# Patient Record
Sex: Female | Born: 1950 | Race: White | Hispanic: No | Marital: Married
Health system: Southern US, Community
[De-identification: ages and names within clinical notes are randomized; demographics above are authoritative.]

## PROBLEM LIST (undated history)

## (undated) DIAGNOSIS — E079 Disorder of thyroid, unspecified: Secondary | ICD-10-CM

## (undated) DIAGNOSIS — E039 Hypothyroidism, unspecified: Secondary | ICD-10-CM

## (undated) DIAGNOSIS — K900 Celiac disease: Secondary | ICD-10-CM

## (undated) DIAGNOSIS — M810 Age-related osteoporosis without current pathological fracture: Secondary | ICD-10-CM

## (undated) HISTORY — DX: Celiac disease: K90.0

---

## 2002-01-02 ENCOUNTER — Ambulatory Visit (HOSPITAL_COMMUNITY): Admission: RE | Admit: 2002-01-02 | Discharge: 2002-01-02 | Payer: Self-pay | Admitting: Ophthalmology

## 2003-02-09 ENCOUNTER — Other Ambulatory Visit: Admission: RE | Admit: 2003-02-09 | Discharge: 2003-02-09 | Payer: Self-pay | Admitting: *Deleted

## 2005-01-25 ENCOUNTER — Encounter: Admission: RE | Admit: 2005-01-25 | Discharge: 2005-01-25 | Payer: Self-pay | Admitting: *Deleted

## 2012-05-13 ENCOUNTER — Ambulatory Visit: Payer: 59 | Attending: Internal Medicine | Admitting: Physical Therapy

## 2012-05-13 DIAGNOSIS — IMO0001 Reserved for inherently not codable concepts without codable children: Secondary | ICD-10-CM | POA: Insufficient documentation

## 2012-05-13 DIAGNOSIS — M542 Cervicalgia: Secondary | ICD-10-CM | POA: Insufficient documentation

## 2012-05-13 DIAGNOSIS — M25519 Pain in unspecified shoulder: Secondary | ICD-10-CM | POA: Insufficient documentation

## 2012-05-21 ENCOUNTER — Encounter: Payer: 59 | Admitting: Physical Therapy

## 2012-05-23 ENCOUNTER — Encounter: Payer: 59 | Admitting: Physical Therapy

## 2012-05-28 ENCOUNTER — Encounter: Payer: 59 | Admitting: Physical Therapy

## 2012-05-30 ENCOUNTER — Encounter: Payer: 59 | Admitting: Physical Therapy

## 2013-11-12 ENCOUNTER — Ambulatory Visit: Payer: 59 | Admitting: Dietician

## 2014-01-21 ENCOUNTER — Encounter: Payer: 59 | Attending: Gastroenterology | Admitting: Dietician

## 2014-01-21 ENCOUNTER — Encounter: Payer: Self-pay | Admitting: Dietician

## 2014-01-21 VITALS — Ht 64.0 in | Wt 168.1 lb

## 2014-01-21 DIAGNOSIS — K900 Celiac disease: Secondary | ICD-10-CM | POA: Diagnosis present

## 2014-01-21 NOTE — Progress Notes (Signed)
  Medical Nutrition Therapy:  Appt start time: 0915 end time:  1000.   Assessment:  Primary concerns today: Penny Russell is here today since she has celiac disease. Was [place] Fermodiagnosed in April but feels like she has likely had it for years. Does not have many symptoms except "burping".  Has had hair thinning, low iron, low Vitamin D, and low calcium. Levels have all increased since since taking supplements and hair is less thin.  States she likes "junk food" and buying gluten free products since April. Starting to look for more "hidden gluten". Tries to follow diet but isn't 100% following it.   Husband recently had back surgery. Lives with him and does the food shopping and food preparation. Goes out 2-3 x week, asks if they have a "gluten free menu". Travels pretty often to the beach, and a few other trips per year (every 2-3 months).   Has separated out condiments at home though still shares a toaster. Trying not to sharing food prep items.    Preferred Learning Style:   No preference indicated   Learning Readiness:   Ready  MEDICATIONS: see list   DIETARY INTAKE:  Usual eating pattern includes 3 meals and 1-3 snacks per day.  Avoided foods include: gluten, salmon, beet    24-hr recall:  B ( AM): egg and gluten free toast with sugar free jelly, gluten free [place]key bacon maybe GF oatmeal with coffee, skim milk Snk ( AM): hershey's kisses or bar, GF chex cereal, GF pretzels, apple or banana  L ( PM): grilled chicken or [place]urkey or grilled nuggets Snk ( PM): depends - hershey's kisses or bar, GF chex cereal, GF pretzels, apple or banana  D ( PM): grilled meat (beef, fish, chicken, hamburger) sometimes beans with vegetables, potatoes/sweet potatoes Snk ( PM): cereal, low carb ice cream popsicle Beverages: coffee, milk, Diet Coke, water, crystal light   Usual physical activity: 10,000 steps per day  Estimated energy needs: 1600 calories 180 g carbohydrates 120 g protein 44 g fat  Progress  Towards Goal(s):  In progress.   Nutritional Diagnosis:  [place]-1.4 Altered GI function As related to intake of gluten containing foods.  As evidenced by diagnosis of Celiac Disease.    Intervention:  Nutrition counseling provided. Plan: Separate iron and calcium since they bind together. Continue following a gluten free diet as closely as possible. Be wary of cross contamination in your kitchen and when you go out to eat.  Check out [facility] website for reliable information: https://[email or web address]/handouts.[email or web address]   Teaching Method Utilized:  Visual Auditory Hands on  Handouts given during visit include:  Gluten-Free Eating Handout  Low Sodium Seasoning Tips  Barriers to learning/adherence to lifestyle change: none  Demonstrated degree of understanding via:  Teach Back   Monitoring/Evaluation:  Dietary intake, exercise, and body weight prn.

## 2014-01-21 NOTE — Patient Instructions (Addendum)
Separate iron and calcium since they bind together. Continue following a gluten free diet as closely as possible. Be wary of cross contamination in your kitchen and when you go out to eat.  Check out [facility] website for reliable information: https://glutenfreediet.ca/handouts.[email or web address]

## 2017-03-27 ENCOUNTER — Other Ambulatory Visit: Payer: Self-pay | Admitting: Internal Medicine

## 2017-03-27 DIAGNOSIS — R1033 Periumbilical pain: Secondary | ICD-10-CM

## 2017-04-02 ENCOUNTER — Ambulatory Visit
Admission: RE | Admit: 2017-04-02 | Discharge: 2017-04-02 | Disposition: A | Discharge status: Home / Self Care | Payer: 59 | Source: Ambulatory Visit | Attending: Internal Medicine | Admitting: Internal Medicine

## 2017-04-02 DIAGNOSIS — R1033 Periumbilical pain: Secondary | ICD-10-CM

## 2017-04-02 MED ORDER — IOPAMIDOL (ISOVUE-300) INJECTION 61%
100.0000 mL | Freq: Once | INTRAVENOUS | Status: AC | PRN
  Administered 2017-04-02: 100 mL via INTRAVENOUS

## 2018-12-25 IMAGING — CT CT ABD-PELV W/ CM
3 of 5 series · 13 of 36 positions shown, 19 images · IV contrast (READICAT/WATER & [ID] ISOVUE 300)
Comparison: None.

CLINICAL DATA: Umbilical pain for 4 weeks.

EXAM:
CT ABDOMEN AND PELVIS WITH CONTRAST
TECHNIQUE: Multidetector CT imaging of the abdomen and pelvis was performed
using the standard protocol following bolus administration of
intravenous contrast.
CONTRAST:  100mL [id number]-RPP IOPAMIDOL ([id number]-RPP) INJECTION 61%

[Series 3: abd/pelvis with · axial · 0.76mm/px · z∈[-291,-16]mm · 6 of 72 slices shown, 11 images]
[im 11/72  soft-tissue]
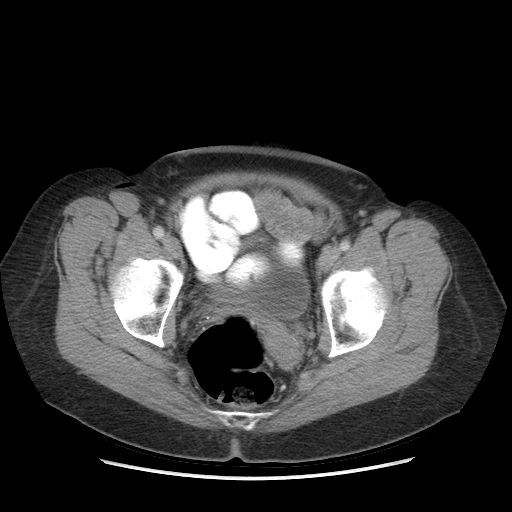
[im 11/72  bone]
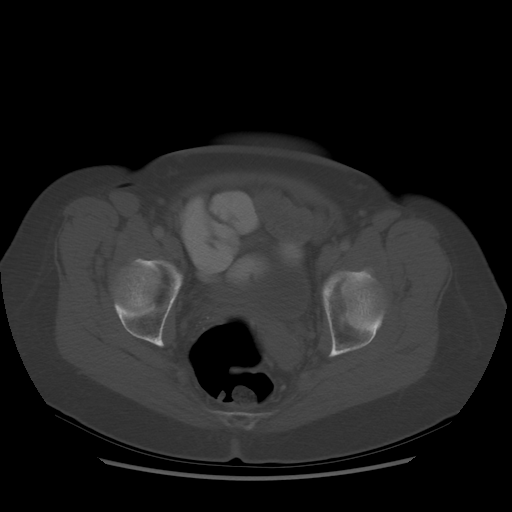
[im 21/72  soft-tissue]
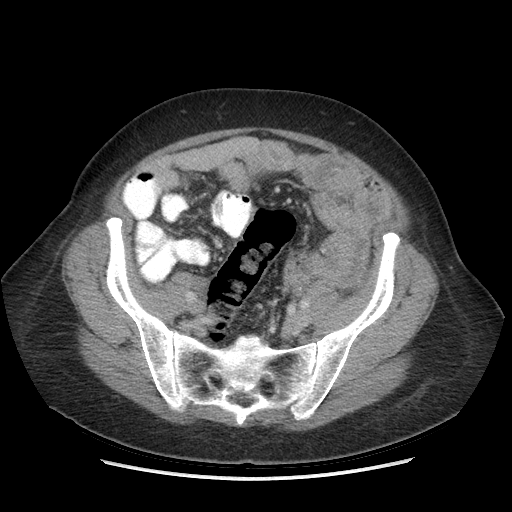
[im 31/72  soft-tissue]
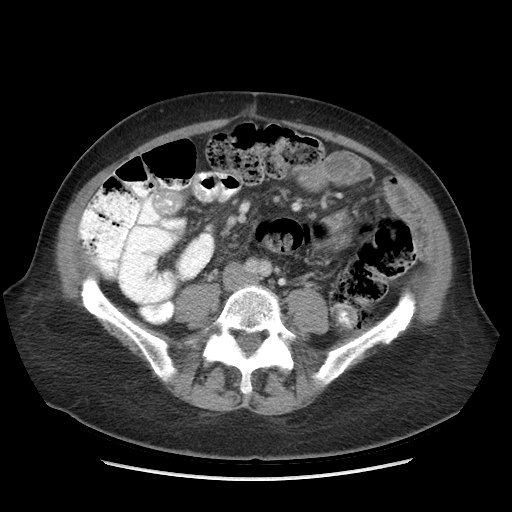
[im 31/72  lung]
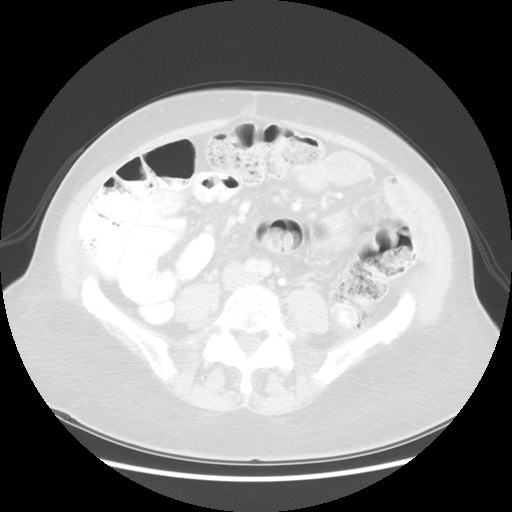
[im 41/72  soft-tissue]
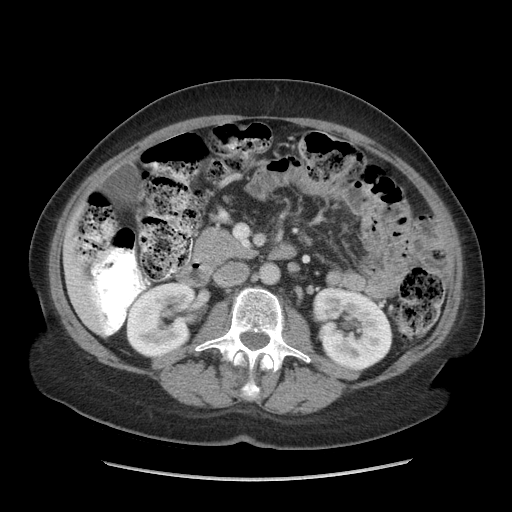
[im 41/72  lung]
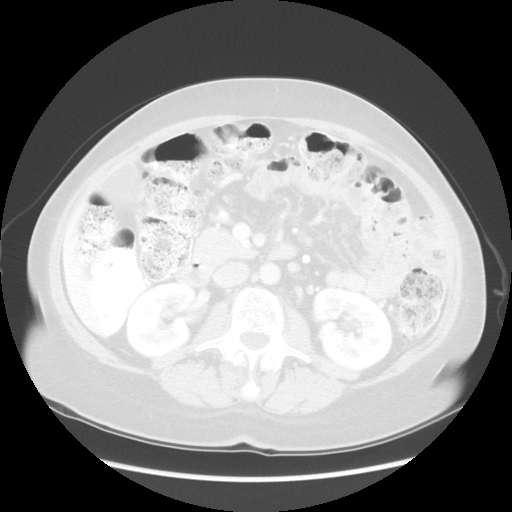
[im 51/72  soft-tissue]
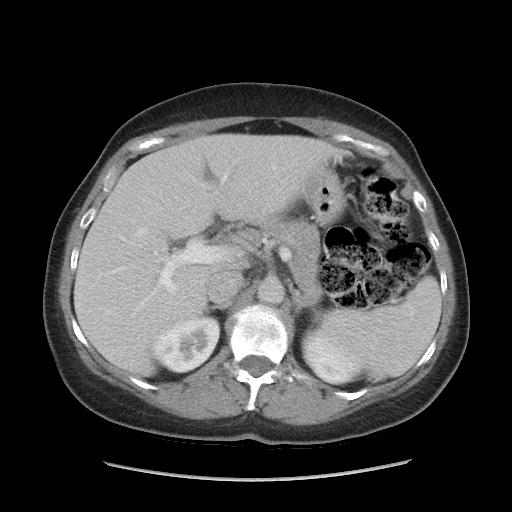
[im 51/72  lung]
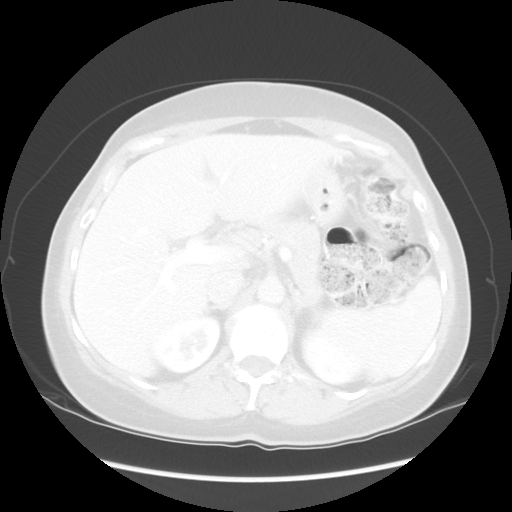
[im 61/72  soft-tissue]
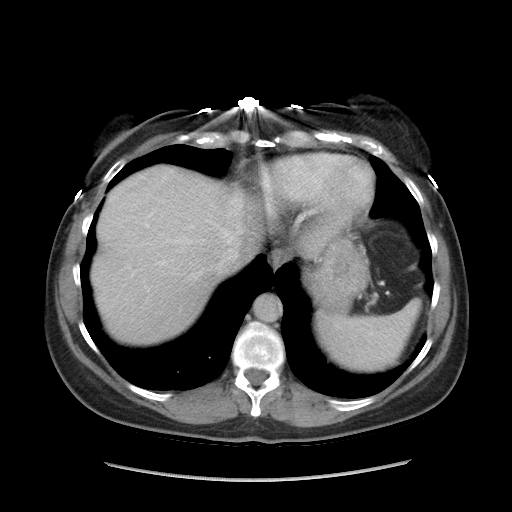
[im 61/72  lung]
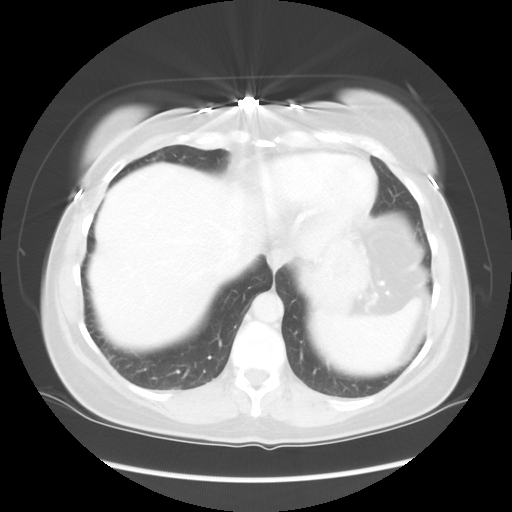

[Series 601: coronal body · coronal · 0.91mm/px · 1 of 125 slices shown, 2 images]
[im 42/125  soft-tissue]
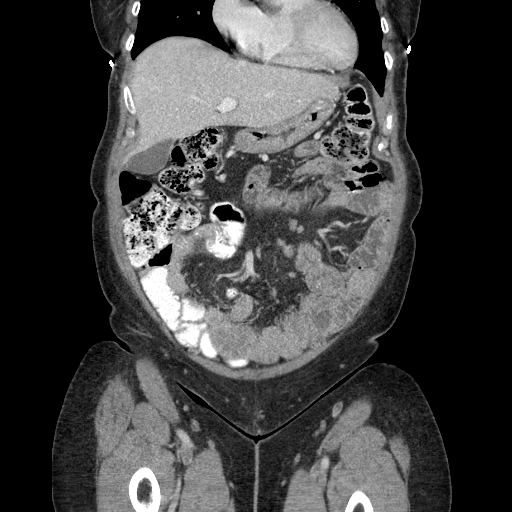
[im 42/125  bone]
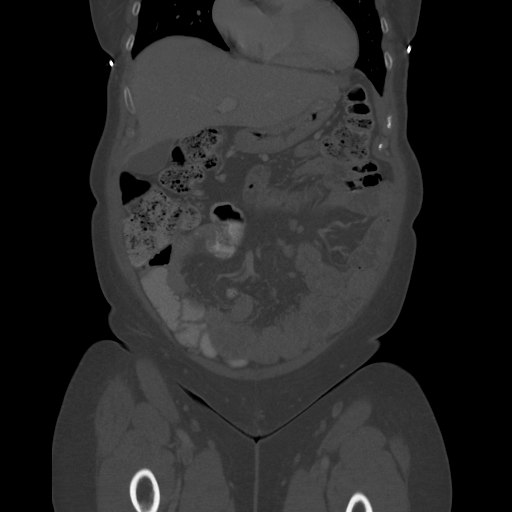

[Series 602: sagittal body · sagittal · 0.91mm/px · 6 of 156 slices shown]
[im 19/156  soft-tissue]
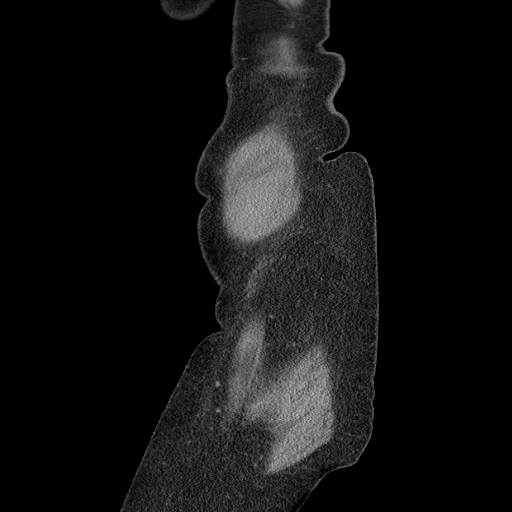
[im 37/156  soft-tissue]
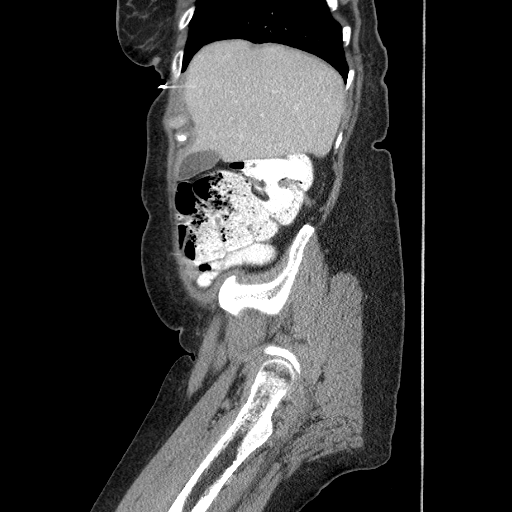
[im 55/156  soft-tissue]
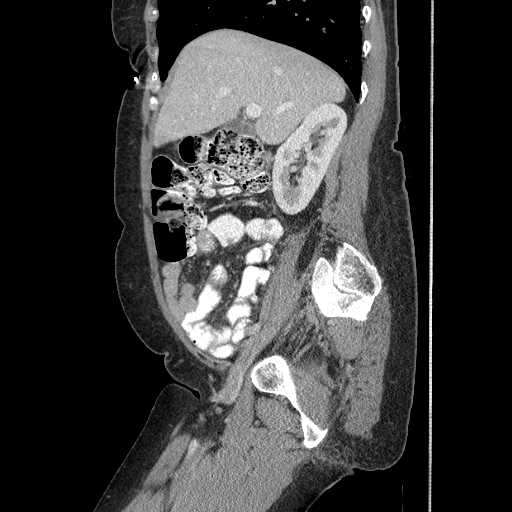
[im 73/156  soft-tissue]
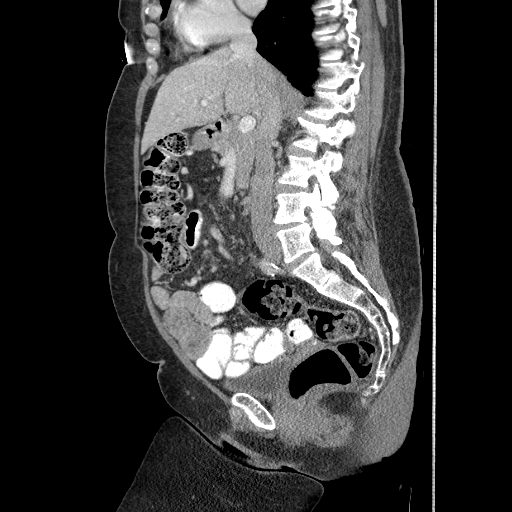
[im 92/156  soft-tissue]
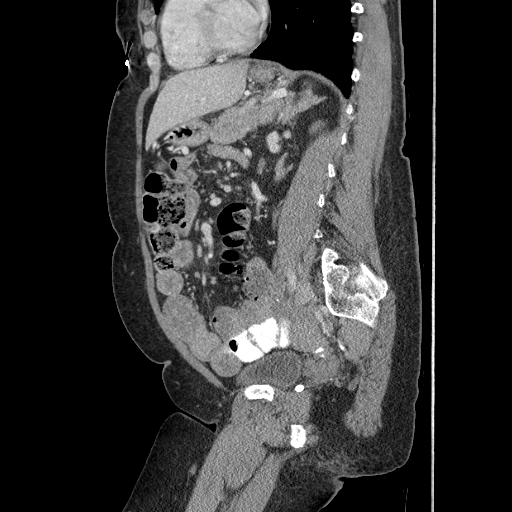
[im 110/156  soft-tissue]
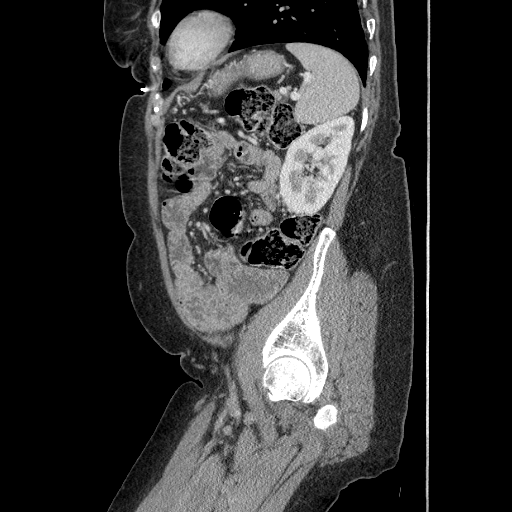

[13 of 36 positions shown; findings below may reference images not displayed]

FINDINGS: Lower chest: Lung bases are clear. No effusions. Heart is normal
size.

Hepatobiliary: No focal hepatic abnormality. Gallbladder
unremarkable.

Pancreas: No focal abnormality or ductal dilatation.

Spleen: No focal abnormality.  Normal size.

Adrenals/Urinary Tract: No adrenal abnormality. No focal renal
abnormality. No stones or hydronephrosis. Urinary bladder is
unremarkable.

Stomach/Bowel: Moderate stool burden throughout the colon. No
evidence of bowel obstruction. Appendix is normal.

Vascular/Lymphatic: No evidence of aneurysm or adenopathy.

Reproductive: Uterus and adnexa unremarkable.  No mass.

Other: No free fluid or free air.

Musculoskeletal: No acute bony abnormality.
IMPRESSION: No acute findings in the abdomen or pelvis.

Moderate stool burden throughout the colon.

## 2020-04-29 ENCOUNTER — Ambulatory Visit: Payer: Self-pay | Attending: Internal Medicine

## 2020-04-29 DIAGNOSIS — Z23 Encounter for immunization: Secondary | ICD-10-CM

## 2020-04-29 NOTE — Progress Notes (Signed)
   Covid-19 Vaccination Clinic  Name:  Madisson Kulaga    MRN: [id number] DOB: [birth year]  04/29/2020  Ms. Vickroy was observed post Covid-19 immunization for 15 minutes without incident. She was provided with Vaccine Information Sheet and instruction to access the V-Safe system.   Ms. Seaberg was instructed to call 911 with any severe reactions post vaccine: Marland Kitchen Difficulty breathing  . Swelling of face and throat  . A fast heartbeat  . A bad rash all over body  . Dizziness and weakness

## 2022-12-20 ENCOUNTER — Ambulatory Visit: Payer: Self-pay | Admitting: Podiatry

## 2022-12-20 DIAGNOSIS — M722 Plantar fascial fibromatosis: Secondary | ICD-10-CM

## 2022-12-20 NOTE — Progress Notes (Signed)
   Chief Complaint  Patient presents with   Foot Pain    Patient came in today for right foot ankle swelling and heel pain, medial side of the ankle, started 5 days ago, patient denies any pain at this time, X-Rays done today     HPI: 72 y.o. female presenting today as a new patient for evaluation of right lateral heel pain extending up into her ankle that began about 1 week ago.  She says that she was walking her dog which she always does about 4-5 miles per day in her regular tennis shoes.  She denies any history of injury.  The following morning she had significant pain and tenderness to the lateral aspect of the right heel.  She was unable to bear weight the following morning.  Over the past 5 days there has been significant improvement.  She states that currently she does not even feel any pain or tenderness with walking.  There is only some slight tenderness with palpation to the area.  Presenting for further treatment and evaluation  Past Medical History:  Diagnosis Date   Celiac disease     Past Surgical History:  Procedure Laterality Date   CESAREAN SECTION      Not on File   Physical Exam: General: The patient is alert and oriented x3 in no acute distress.  Dermatology: Skin is warm, dry and supple bilateral lower extremities.   Vascular: Palpable pedal pulses bilaterally. Capillary refill within normal limits.  No appreciable edema.  No erythema.  Neurological: Grossly intact via light touch  Musculoskeletal Exam: No pedal deformities noted.  No tenderness or pain with palpation around the plantar heel.  Muscle strength 5/5 all compartments.  Radiographic Exam RT foot and ankle 12/20/2022:  Normal osseous mineralization. Joint spaces preserved.  No fractures or osseous irregularities noted.  Impression: Negative  Assessment/Plan of Care: 1.  Right plantar lateral heel pain; resolved  -Patient evaluated.  X-rays reviewed -Patient has had significant improvement over  the last 5 days and is currently asymptomatic. -Recommend good supportive tennis shoes and sneakers.  Slowly increase activity.  She has not been walking 4-5 miles per day over the past week so I suggest she eases back into her walking.  Increase mileage in small increments over the following week -Return to clinic as needed       Felecia Shelling, DPM [facility]  Dr. Felecia Shelling, DPM    [address] [place], [place] [address]                Office [phone]  Fax [phone]

## 2023-03-08 ENCOUNTER — Ambulatory Visit: Payer: Self-pay | Admitting: Orthopedic Surgery

## 2023-04-09 DIAGNOSIS — H938X3 Other specified disorders of ear, bilateral: Secondary | ICD-10-CM | POA: Insufficient documentation

## 2023-05-30 DIAGNOSIS — M26623 Arthralgia of bilateral temporomandibular joint: Secondary | ICD-10-CM | POA: Insufficient documentation

## 2023-07-18 DIAGNOSIS — M8588 Other specified disorders of bone density and structure, other site: Secondary | ICD-10-CM | POA: Diagnosis not present

## 2023-07-18 DIAGNOSIS — Z1231 Encounter for screening mammogram for malignant neoplasm of breast: Secondary | ICD-10-CM | POA: Diagnosis not present

## 2023-07-18 DIAGNOSIS — I10 Essential (primary) hypertension: Secondary | ICD-10-CM | POA: Diagnosis not present

## 2023-07-18 DIAGNOSIS — E039 Hypothyroidism, unspecified: Secondary | ICD-10-CM | POA: Diagnosis not present

## 2023-07-24 DIAGNOSIS — M810 Age-related osteoporosis without current pathological fracture: Secondary | ICD-10-CM | POA: Diagnosis not present

## 2023-07-24 DIAGNOSIS — E559 Vitamin D deficiency, unspecified: Secondary | ICD-10-CM | POA: Diagnosis not present

## 2023-07-25 ENCOUNTER — Other Ambulatory Visit: Payer: Self-pay

## 2023-07-25 ENCOUNTER — Emergency Department (HOSPITAL_COMMUNITY)
Admission: EM | Admit: 2023-07-25 | Discharge: 2023-07-25 | Disposition: A | Discharge status: Home / Self Care | Payer: Medicare Other | Attending: Emergency Medicine | Admitting: Emergency Medicine

## 2023-07-25 ENCOUNTER — Encounter (HOSPITAL_COMMUNITY): Payer: Self-pay

## 2023-07-25 ENCOUNTER — Emergency Department (HOSPITAL_COMMUNITY): Payer: Medicare Other

## 2023-07-25 DIAGNOSIS — M25511 Pain in right shoulder: Secondary | ICD-10-CM | POA: Insufficient documentation

## 2023-07-25 DIAGNOSIS — M542 Cervicalgia: Secondary | ICD-10-CM | POA: Diagnosis not present

## 2023-07-25 DIAGNOSIS — Y92009 Unspecified place in unspecified non-institutional (private) residence as the place of occurrence of the external cause: Secondary | ICD-10-CM | POA: Insufficient documentation

## 2023-07-25 DIAGNOSIS — S0990XA Unspecified injury of head, initial encounter: Secondary | ICD-10-CM | POA: Insufficient documentation

## 2023-07-25 DIAGNOSIS — M25512 Pain in left shoulder: Secondary | ICD-10-CM | POA: Diagnosis not present

## 2023-07-25 DIAGNOSIS — E039 Hypothyroidism, unspecified: Secondary | ICD-10-CM | POA: Diagnosis not present

## 2023-07-25 DIAGNOSIS — R519 Headache, unspecified: Secondary | ICD-10-CM | POA: Diagnosis not present

## 2023-07-25 DIAGNOSIS — Z79899 Other long term (current) drug therapy: Secondary | ICD-10-CM | POA: Insufficient documentation

## 2023-07-25 DIAGNOSIS — W01198A Fall on same level from slipping, tripping and stumbling with subsequent striking against other object, initial encounter: Secondary | ICD-10-CM | POA: Diagnosis not present

## 2023-07-25 DIAGNOSIS — W19XXXA Unspecified fall, initial encounter: Secondary | ICD-10-CM

## 2023-07-25 DIAGNOSIS — Y9389 Activity, other specified: Secondary | ICD-10-CM | POA: Diagnosis not present

## 2023-07-25 HISTORY — DX: Disorder of thyroid, unspecified: E07.9

## 2023-07-25 HISTORY — DX: Hypothyroidism, unspecified: E03.9

## 2023-07-25 HISTORY — DX: Age-related osteoporosis without current pathological fracture: M81.0

## 2023-07-25 MED ORDER — ACETAMINOPHEN 500 MG PO TABS
1000.0000 mg | ORAL_TABLET | Freq: Once | ORAL | Status: AC
  Administered 2023-07-25: 1000 mg via ORAL
  Filled 2023-07-25: qty 2

## 2023-07-25 NOTE — ED Notes (Signed)
 Pt ambulated to BR with steady gait.

## 2023-07-25 NOTE — ED Notes (Signed)
 Patient transported to CT

## 2023-07-25 NOTE — ED Notes (Signed)
 ED Provider at bedside.

## 2023-07-25 NOTE — ED Triage Notes (Signed)
 Pt arrived via POV from home. Pt reports she was chipping ice outside her home when she slipped and fell backwards and smacked the back of her head on the ice, on concrete. Pt denies LOC. Pt denies LOC.

## 2023-07-25 NOTE — Discharge Instructions (Addendum)
 Thank you for coming to [facility] Emergency Department. You were seen for fall and head trauma. Your CT scan was negative. You can alternate taking Tylenol  and ibuprofen as needed for pain. You can take 650mg  tylenol  (acetaminophen ) every 4-6 hours, and 600 mg ibuprofen 3 times a day. Please follow up with your primary care provider within 1 week  as needed or if symptoms worsen.   Do not hesitate to return to the ED or call 911 if you experience: -Worsening symptoms -Changes to your vision -Confusion, slurred speech, or stroke-like symptoms -Lightheadedness, passing out -Fevers/chills -Anything else that concerns you

## 2023-07-25 NOTE — ED Provider Notes (Signed)
 [place] EMERGENCY DEPARTMENT AT [facility] Provider Note   CSN: [id number] Arrival date & time: 07/25/23  1645     History  Chief Complaint  Patient presents with   Penny Russell is a 73 y.o. female with PMH as listed below who presents POV from home. Pt reports she was chipping ice outside her home when she slipped and fell backwards and smacked the back of her head on the ice, on concrete. Pt denies LOC. Doesn't take blood thinners. No N/V.  Reports that she is sore in her shoulders and anterior neck from the fall but denies any midline neck pain or headache.  No other injuries from the fall.  Otherwise was in her normal state of health.   Past Medical History:  Diagnosis Date   Celiac disease    Hypothyroidism    Osteoporosis    Thyroid  disease        Home Medications Prior to Admission medications   Medication Sig Start Date End Date Taking? Authorizing Provider  Ascorbic Acid (VITAMIN C) 1000 MG tablet Take 1,000 mg by mouth daily.    [provider]  Biotin 10 MG TABS Take by mouth.    [provider]  Calcium-Magnesium-Vitamin D (CALCIUM 500 PO) Take by mouth.    [provider]  Cyanocobalamin (B-12) 2500 MCG TABS Take by mouth.    [provider]  Ferrous Sulfate (IRON) 325 (65 FE) MG TABS Take 625 mg by mouth.    [provider]  levothyroxine (SYNTHROID, LEVOTHROID) 200 MCG tablet Take 200 mcg by mouth daily before breakfast.    [provider]  levothyroxine (SYNTHROID, LEVOTHROID) 25 MCG tablet Take 25 mcg by mouth daily before breakfast.    [provider]  Multiple Vitamins-Minerals (MULTIVITAMIN & MINERAL PO) Take by mouth.    [provider]  Omega-3 Fatty Acids (FISH OIL) 500 MG CAPS Take by mouth.    [provider]  triamterene (DYRENIUM) 50 MG capsule Take 50 mg by mouth 2 (two) times daily.    [provider]  Vitamin D, Cholecalciferol, 1000  UNITS TABS Take 4,000 Int'l Units by mouth.    [provider]      Allergies    Patient has no allergy information on record.    Review of Systems   Review of Systems A 10 point review of systems was performed and is negative unless otherwise reported in HPI.  Physical Exam Updated Vital Signs BP 123/70 (BP Location: Right Arm)   Pulse 78   Temp 98.7 F (37.1 C) (Oral)   Resp 18   Ht 5\' 4"  (1.626 m)   Wt 76 kg   SpO2 99%   BMI 28.76 kg/m  Physical Exam General: Normal appearing female, sitting in bed.  HEENT: NCAT. PERRLA, EOMI, Sclera anicteric, MMM, trachea midline. No nystagmus. No raccoon eyes or battle's sign. Stable midface, no e/o facial trauma. No skull depressions/deformities. Cardiology: RRR, no murmurs/rubs/gallops.  Resp: Normal respiratory rate and effort. CTAB, no wheezes, rhonchi, crackles.  Abd: Soft, non-tender, non-distended. No rebound tenderness or guarding.  GU: Deferred. MSK: No peripheral edema or signs of trauma. Extremities without deformity or TTP.  Skin: warm, dry.  Back: No midline C, T, or L spine TTP or stepoffs. Neuro: A&Ox4, CNs II-XII grossly intact. MAEs. Sensation grossly intact.  Psych: Normal mood and affect.   ED Results / Procedures / Treatments   Labs (all labs ordered are listed,  but only abnormal results are displayed) Labs Reviewed - No data to display  EKG None  Radiology CT Head Wo Contrast Result Date: 07/25/2023 CLINICAL DATA:  Minor head trauma. Slip and fell on ice striking the back of the head. No loss of consciousness. EXAM: CT HEAD WITHOUT CONTRAST TECHNIQUE: Contiguous axial images were obtained from the base of the skull through the vertex without intravenous contrast. RADIATION DOSE REDUCTION: This exam was performed according to the departmental dose-optimization program which includes automated exposure control, adjustment of the mA and/or kV according to patient size and/or use of iterative reconstruction  technique. COMPARISON:  None Available. FINDINGS: Brain: No evidence of acute infarction, hemorrhage, hydrocephalus, extra-axial collection or mass lesion/mass effect. Mild diffuse cerebral atrophy. Vascular: No hyperdense vessel or unexpected calcification. Skull: No acute depressed skull fractures identified. Sinuses/Orbits: Air-fluid levels in the maxillary antra bilaterally most likely representing sinusitis. No fractures identified in the visualized facial bones. Mastoid air cells are clear. Congenital nonunion of the posterior arch of C1. Other: None. IMPRESSION: 1. No acute intracranial abnormalities.  Mild chronic atrophy. 2. Air-fluid levels in the maxillary antra bilaterally could represent acute sinusitis. Electronically Signed   By: Boyce Byes M.D.   On: 07/25/2023 18:53    Procedures Procedures    Medications Ordered in ED Medications  acetaminophen  (TYLENOL ) tablet 1,000 mg (1,000 mg Oral Given 07/25/23 1751)    ED Course/ Medical Decision Making/ A&P                          Medical Decision Making Amount and/or Complexity of Data Reviewed Radiology: ordered. Decision-making details documented in ED Course.  Risk OTC drugs.    This patient presents to the ED for concern of fall, this involves an extensive number of treatment options, and is a complaint that carries with it a high risk of complications and morbidity.  I considered the following differential and admission for this acute, potentially life threatening condition.   MDM:    Cannot apply a [place] head CT rule for head trauma as patient is 73 years old.  I discussed this with the patient and her husband.  Patient is overall very well-appearing and has had no loss of consciousness, nausea vomiting, headache, blurry vision.  Offered options including discharge with close monitoring versus CT head here.  Patient would like to obtain a CT to rule out ICH.  Patient does rule out by CT C-spine criteria and has no  midline tenderness, no pain with range of motion, very low concern for cervical fracture.  Clinical Course as of 07/25/23 1919  Wed Jul 25, 2023  1856 CT Head Wo Contrast 1. No acute intracranial abnormalities.  Mild chronic atrophy. 2. Air-fluid levels in the maxillary antra bilaterally could represent acute sinusitis.   [HN]  1918 Pt has had chronic rhinorrhea in the morning for months. Patient reports no f/c, facial pain, sinus pressure or headaches, no purulent nasal discharge. Unlikely to represent acute bacterial sinusitis. Informed of her imaging findings and advised f/u with PCP. [HN]    Clinical Course User Index [HN] Merdis Stalling, MD    Imaging Studies ordered: I ordered imaging studies including CTH I independently visualized and interpreted imaging. I agree with the radiologist interpretation  Additional history obtained from chart review, husband at bedside.    Reevaluation: After the interventions noted above, I reevaluated the patient and found that they have :improved  Social Determinants of Health:  lives independently  Disposition:  DC w/ discharge instructions/return precautions. All questions answered to patient's satisfaction.    Co morbidities that complicate the patient evaluation  Past Medical History:  Diagnosis Date   Celiac disease    Hypothyroidism    Osteoporosis    Thyroid  disease      Medicines Meds ordered this encounter  Medications   acetaminophen  (TYLENOL ) tablet 1,000 mg    I have reviewed the patients home medicines and have made adjustments as needed  Problem List / ED Course: Problem List Items Addressed This Visit   None Visit Diagnoses       Fall in home, initial encounter    -  Primary                   This note was created using dictation software, which may contain spelling or grammatical errors.    Merdis Stalling, MD 07/25/23 [phone]

## 2023-08-01 DIAGNOSIS — M810 Age-related osteoporosis without current pathological fracture: Secondary | ICD-10-CM | POA: Diagnosis not present

## 2023-08-01 DIAGNOSIS — S99912A Unspecified injury of left ankle, initial encounter: Secondary | ICD-10-CM | POA: Diagnosis not present

## 2023-08-08 DIAGNOSIS — M25551 Pain in right hip: Secondary | ICD-10-CM | POA: Diagnosis not present

## 2023-08-08 DIAGNOSIS — M9902 Segmental and somatic dysfunction of thoracic region: Secondary | ICD-10-CM | POA: Diagnosis not present

## 2023-08-08 DIAGNOSIS — M9905 Segmental and somatic dysfunction of pelvic region: Secondary | ICD-10-CM | POA: Diagnosis not present

## 2023-08-08 DIAGNOSIS — M9903 Segmental and somatic dysfunction of lumbar region: Secondary | ICD-10-CM | POA: Diagnosis not present

## 2023-08-08 DIAGNOSIS — M62830 Muscle spasm of back: Secondary | ICD-10-CM | POA: Diagnosis not present

## 2023-08-10 DIAGNOSIS — M25551 Pain in right hip: Secondary | ICD-10-CM | POA: Diagnosis not present

## 2023-08-10 DIAGNOSIS — M9905 Segmental and somatic dysfunction of pelvic region: Secondary | ICD-10-CM | POA: Diagnosis not present

## 2023-08-10 DIAGNOSIS — M9902 Segmental and somatic dysfunction of thoracic region: Secondary | ICD-10-CM | POA: Diagnosis not present

## 2023-08-10 DIAGNOSIS — M9903 Segmental and somatic dysfunction of lumbar region: Secondary | ICD-10-CM | POA: Diagnosis not present

## 2023-08-10 DIAGNOSIS — M62830 Muscle spasm of back: Secondary | ICD-10-CM | POA: Diagnosis not present

## 2023-08-17 DIAGNOSIS — M62830 Muscle spasm of back: Secondary | ICD-10-CM | POA: Diagnosis not present

## 2023-08-17 DIAGNOSIS — M9905 Segmental and somatic dysfunction of pelvic region: Secondary | ICD-10-CM | POA: Diagnosis not present

## 2023-08-17 DIAGNOSIS — M9902 Segmental and somatic dysfunction of thoracic region: Secondary | ICD-10-CM | POA: Diagnosis not present

## 2023-08-17 DIAGNOSIS — M25551 Pain in right hip: Secondary | ICD-10-CM | POA: Diagnosis not present

## 2023-08-17 DIAGNOSIS — M9903 Segmental and somatic dysfunction of lumbar region: Secondary | ICD-10-CM | POA: Diagnosis not present

## 2023-08-31 DIAGNOSIS — M25551 Pain in right hip: Secondary | ICD-10-CM | POA: Diagnosis not present

## 2023-08-31 DIAGNOSIS — M9902 Segmental and somatic dysfunction of thoracic region: Secondary | ICD-10-CM | POA: Diagnosis not present

## 2023-08-31 DIAGNOSIS — M9903 Segmental and somatic dysfunction of lumbar region: Secondary | ICD-10-CM | POA: Diagnosis not present

## 2023-08-31 DIAGNOSIS — M9905 Segmental and somatic dysfunction of pelvic region: Secondary | ICD-10-CM | POA: Diagnosis not present

## 2023-08-31 DIAGNOSIS — M62830 Muscle spasm of back: Secondary | ICD-10-CM | POA: Diagnosis not present

## 2023-09-10 ENCOUNTER — Ambulatory Visit: Payer: Self-pay | Admitting: Orthopedic Surgery

## 2023-09-10 ENCOUNTER — Encounter: Payer: Self-pay | Admitting: Orthopedic Surgery

## 2023-09-10 VITALS — BP 125/73 | HR 78 | Ht 63.0 in | Wt 143.0 lb

## 2023-09-10 DIAGNOSIS — M76821 Posterior tibial tendinitis, right leg: Secondary | ICD-10-CM | POA: Diagnosis not present

## 2023-09-10 DIAGNOSIS — M9905 Segmental and somatic dysfunction of pelvic region: Secondary | ICD-10-CM | POA: Diagnosis not present

## 2023-09-10 DIAGNOSIS — M9903 Segmental and somatic dysfunction of lumbar region: Secondary | ICD-10-CM | POA: Diagnosis not present

## 2023-09-10 DIAGNOSIS — M9902 Segmental and somatic dysfunction of thoracic region: Secondary | ICD-10-CM | POA: Diagnosis not present

## 2023-09-10 DIAGNOSIS — M25551 Pain in right hip: Secondary | ICD-10-CM | POA: Diagnosis not present

## 2023-09-10 DIAGNOSIS — M62830 Muscle spasm of back: Secondary | ICD-10-CM | POA: Diagnosis not present

## 2023-09-10 NOTE — Progress Notes (Signed)
   Chief Complaint  Patient presents with   Ankle Pain    Patient has soreness in the right  ankle with needle like feeling and husband wrapped it and iced it and it is a little better   started hurting 08/29/23    73 year old female presents with right medial ankle pain.  She had a couple of incidents where 1 she fell on the ice someone she fell while putting up a picture.  She presents with BL ankle pain which is getting better.  Her husband wrapped the ankle seem to help  Her sneakers for walking are about 71 years old.  Ankle Pain    Review of Systems  Musculoskeletal:  Positive for falls.   Past Medical History:  Diagnosis Date   Celiac disease    Hypothyroidism    Osteoporosis    Thyroid disease    BP 125/73   Pulse 78   Ht 5\' 3"  (1.6 m)   Wt 143 lb (64.9 kg)   BMI 25.33 kg/m   Physical Exam Vitals and nursing note reviewed.  Constitutional:      Appearance: Normal appearance.  HENT:     Head: Normocephalic and atraumatic.  Eyes:     General: No scleral icterus.       Right eye: No discharge.        Left eye: No discharge.     Extraocular Movements: Extraocular movements intact.     Conjunctiva/sclera: Conjunctivae normal.     Pupils: Pupils are equal, round, and reactive to light.  Cardiovascular:     Rate and Rhythm: Normal rate.     Pulses: Normal pulses.  Musculoskeletal:     Comments: Single-leg heel rise patient does well with no problems  However she does have a fallen arch on the right.  Tenderness on exam.  No weakness.  Ankle joint stable.  Skin:    General: Skin is warm and dry.     Capillary Refill: Capillary refill takes less than 2 seconds.  Neurological:     General: No focal deficit present.     Mental Status: She is alert and oriented to person, place, and time.  Psychiatric:        Mood and Affect: Mood normal.        Behavior: Behavior normal.        Thought Content: Thought content normal.        Judgment: Judgment normal.     Encounter Diagnosis  Name Primary?   Posterior tibial tendon dysfunction (PTTD) of right lower extremity Yes    Assessment and plan mild tendinitis from microtear right PTTD  Recommend supportive shoes no interventions needed at this time as she is improving

## 2023-09-10 NOTE — Patient Instructions (Signed)
Posterior Tibial Tendinitis Posterior tibial tendinitis is irritation of a tendon called the posterior tibial tendon. Your posterior tibial tendon is a cord-like tissue that connects calf muscles to your foot. They work together to: Support your arch. Help you rise up on your toes. Help you turn your foot down and in (inversion). This condition causes foot and ankle pain. It can also lead to a flat foot. What are the causes? This condition is most often caused by repeated stress to the tendon (overuse injury). It can also be caused by a sudden injury that stresses the tendon, such as landing on your foot after jumping or falling. What increases the risk? This condition is more likely to develop in: People who play a sport that involves putting a lot of pressure on the feet, such as basketball, tennis, soccer, and hockey. Runners. Females who are older than 71 years of age and are overweight. People with diabetes. People with decreased foot stability. People with flat feet. What are the signs or symptoms? Symptoms include: Pain in the inner ankle. Pain at the arch of your foot. Pain that gets worse with running, walking, or standing. Swelling on the inside of your ankle and foot. Weakness in your ankle or foot. Inability to stand up on tiptoe. Flattening of the arch of your foot. How is this diagnosed? This condition may be diagnosed based on: Your symptoms. Your medical history. A physical exam. Tests, such as X-ray, MRI, or ultrasound. How is this treated? This condition may be treated by: Putting ice on the injured area. Taking NSAIDs, such as ibuprofen, to reduce pain and swelling. Wearing a special shoe or shoe insert to support your arch (orthotic). Having physical therapy. Replacing high-impact exercise with low-impact exercise, such as swimming or cycling. If your symptoms do not improve with these treatments, you may need to wear a splint, removable walking boot, or  short leg cast for 6-8 weeks to keep your foot and ankle still (immobilized). Follow these instructions at home: If you have a nonremovable cast or splint: Do not put pressure on any part of the cast or splint until it is fully hardened. This may take several hours. Do not stick anything inside the cast or splint to scratch your skin. Doing that increases your risk of infection. Check the skin around the cast or splint every day. Tell your health care provider about any concerns. You may put lotion on dry skin around the edges of the cast or splint. Do not put lotion on the skin underneath it. Keep the cast or splint clean and dry. If you have a removable boot: Wear the boot as told by your provider. Remove it only as told by your provider. Check the skin around the boot every day. Tell your provider about any concerns. Loosen the boot if your toes tingle, become numb, or turn cold and blue. Keep the boot clean and dry. Bathing Do not take baths, swim, or use a hot tub until your provider approves. Ask your provider if you may take showers. If your cast, splint, or boot is not waterproof: Do not let it get wet. Cover it with a waterproof covering while you take a bath or a shower. Managing pain and swelling  If told, put ice on the injured area. If you have a removable boot, remove it as told by your provider. Put ice in a plastic bag. Place a towel between your skin and the bag or between your cast and the bag.   Leave the ice on for 20 minutes, 2-3 times a day. If your skin turns bright red, remove the ice right away to prevent skin damage. The risk of damage is higher if you cannot feel pain, heat, or cold. Move your toes often to reduce stiffness and swelling. Raise (elevate) the injured area above the level of your heart while you are sitting or lying down. Activity Do not use the injured foot to support your body weight until your provider says that you can. Use crutches as told by  your provider. Do not do activities that make pain or swelling worse. Ask your provider when it is safe to drive if you have a cast, splint, or boot on your foot. Do exercises as told by your provider. Return to your normal activities as told by your provider. Ask your provider what activities are safe for you. General instructions Take over-the-counter and prescription medicines only as told by your provider. If you have an orthotic, use it as told by your provider. How is this prevented? Wear footwear that is appropriate for your athletic activity. Avoid athletic activities that cause pain or swelling in your ankle or foot. Warm up and stretch before being active. Stop activity if you develop pain or swelling while training. See your provider if you have pain or swelling that does not improve after a few days of rest. Start a new athletic activity slowly so you can build up your strength and flexibility. Contact a health care provider if: Your symptoms get worse. Your symptoms do not improve in 6-8 weeks. You develop new, unexplained symptoms. Your splint, boot, or cast gets damaged. This information is not intended to replace advice given to you by your health care provider. Make sure you discuss any questions you have with your health care provider. Document Revised: 07/11/2022 Document Reviewed: 07/11/2022 [employer] Patient Education  2024 [employer]  

## 2023-09-10 NOTE — Progress Notes (Signed)
  Intake history:  BP 125/73   Pulse 78   Ht 5\' 3"  (1.6 m)   Wt 143 lb (64.9 kg)   BMI 25.33 kg/m  Body mass index is 25.33 kg/m.    WHAT ARE WE SEEING YOU FOR TODAY?   right ankle(s)  How long has this bothered you? (DOI?DOS?WS?)  on 08/29/23  Anticoag.  No  Diabetes No  Heart disease No  Hypertension No  SMOKING HX No  Kidney disease No  Any ALLERGIES ______________________________________________   Treatment:  Have you taken:  Tylenol yes  Advil No  Had PT No  Had injection No  Other  _________________________

## 2023-09-26 DIAGNOSIS — M9902 Segmental and somatic dysfunction of thoracic region: Secondary | ICD-10-CM | POA: Diagnosis not present

## 2023-09-26 DIAGNOSIS — M62830 Muscle spasm of back: Secondary | ICD-10-CM | POA: Diagnosis not present

## 2023-09-26 DIAGNOSIS — M25551 Pain in right hip: Secondary | ICD-10-CM | POA: Diagnosis not present

## 2023-09-26 DIAGNOSIS — M9903 Segmental and somatic dysfunction of lumbar region: Secondary | ICD-10-CM | POA: Diagnosis not present

## 2023-09-26 DIAGNOSIS — M9905 Segmental and somatic dysfunction of pelvic region: Secondary | ICD-10-CM | POA: Diagnosis not present

## 2023-10-03 ENCOUNTER — Ambulatory Visit: Admitting: Podiatry

## 2023-10-03 ENCOUNTER — Ambulatory Visit (INDEPENDENT_AMBULATORY_CARE_PROVIDER_SITE_OTHER)

## 2023-10-03 ENCOUNTER — Encounter: Payer: Self-pay | Admitting: Podiatry

## 2023-10-03 DIAGNOSIS — M25571 Pain in right ankle and joints of right foot: Secondary | ICD-10-CM

## 2023-10-03 DIAGNOSIS — M76821 Posterior tibial tendinitis, right leg: Secondary | ICD-10-CM

## 2023-10-03 NOTE — Progress Notes (Signed)
   Chief Complaint  Patient presents with   Ankle Pain    RM#6 Right ankle pain for several months increasing when walking.Patient being treated for osteoporosis.    HPI: 73 y.o. female presenting today for new complaint of pain and tenderness associated to the medial aspect of the right ankle ongoing for several months now.  Gradual idiopathic onset.  She noticed that her right foot seems to collapse more than her other foot.  Kinesiotape helps alleviate some of her symptoms.  Past Medical History:  Diagnosis Date   Celiac disease    Hypothyroidism    Osteoporosis    Thyroid disease     Past Surgical History:  Procedure Laterality Date   CESAREAN SECTION      No Known Allergies   Physical Exam: General: The patient is alert and oriented x3 in no acute distress.  Dermatology: Skin is warm, dry and supple bilateral lower extremities.   Vascular: Palpable pedal pulses bilaterally. Capillary refill within normal limits.  No appreciable edema.  No erythema.  Neurological: Grossly intact via light touch  Musculoskeletal Exam: Collapse of the medial longitudinal arch of the foot noted with tenderness to palpation along the posterior tibial tendon of the right lower extremity.  Associated edema also noted along the medial aspect of the ankle  Radiographic Exam RT foot 10/03/2023:  Normal osseous mineralization.  There does appear to be some degenerative changes noted to the posterior facet of the subtalar joint on lateral view.  No acute fractures identified.  Assessment/Plan of Care: 1.  Posterior tibial tendon dysfunction (PTTD) RT  -Patient evaluated.  X-rays reviewed -Recommend daily arch supports to support the medial longitudinal arch of the foot and support the posterior tibial tendon and foot collapse -Refrain from going barefoot. -Recommend OTC Motrin as needed -Ankle brace dispensed.  Wear daily with activity -Return to clinic as needed       Felecia Shelling,  DPM [facility]  Dr. Felecia Shelling, DPM    [address] [place], [place] [address]                Office [phone]  Fax [phone]

## 2023-11-13 DIAGNOSIS — M62830 Muscle spasm of back: Secondary | ICD-10-CM | POA: Diagnosis not present

## 2023-11-13 DIAGNOSIS — M25551 Pain in right hip: Secondary | ICD-10-CM | POA: Diagnosis not present

## 2023-11-13 DIAGNOSIS — M9903 Segmental and somatic dysfunction of lumbar region: Secondary | ICD-10-CM | POA: Diagnosis not present

## 2023-11-13 DIAGNOSIS — M9905 Segmental and somatic dysfunction of pelvic region: Secondary | ICD-10-CM | POA: Diagnosis not present

## 2023-11-13 DIAGNOSIS — M9902 Segmental and somatic dysfunction of thoracic region: Secondary | ICD-10-CM | POA: Diagnosis not present

## 2023-11-19 DIAGNOSIS — M62830 Muscle spasm of back: Secondary | ICD-10-CM | POA: Diagnosis not present

## 2023-11-19 DIAGNOSIS — M9905 Segmental and somatic dysfunction of pelvic region: Secondary | ICD-10-CM | POA: Diagnosis not present

## 2023-11-19 DIAGNOSIS — M9902 Segmental and somatic dysfunction of thoracic region: Secondary | ICD-10-CM | POA: Diagnosis not present

## 2023-11-19 DIAGNOSIS — M9903 Segmental and somatic dysfunction of lumbar region: Secondary | ICD-10-CM | POA: Diagnosis not present

## 2023-11-19 DIAGNOSIS — M25551 Pain in right hip: Secondary | ICD-10-CM | POA: Diagnosis not present

## 2023-11-30 DIAGNOSIS — C44629 Squamous cell carcinoma of skin of left upper limb, including shoulder: Secondary | ICD-10-CM | POA: Diagnosis not present

## 2023-11-30 DIAGNOSIS — L570 Actinic keratosis: Secondary | ICD-10-CM | POA: Diagnosis not present

## 2023-11-30 DIAGNOSIS — X32XXXD Exposure to sunlight, subsequent encounter: Secondary | ICD-10-CM | POA: Diagnosis not present

## 2023-12-12 ENCOUNTER — Telehealth: Payer: Self-pay | Admitting: Podiatry

## 2023-12-12 NOTE — Telephone Encounter (Signed)
 Currently out of town and will like to pay bill. Can you call her with her options in paying? Thank you.

## 2024-01-03 DIAGNOSIS — Z1283 Encounter for screening for malignant neoplasm of skin: Secondary | ICD-10-CM | POA: Diagnosis not present

## 2024-01-03 DIAGNOSIS — Z08 Encounter for follow-up examination after completed treatment for malignant neoplasm: Secondary | ICD-10-CM | POA: Diagnosis not present

## 2024-01-03 DIAGNOSIS — L570 Actinic keratosis: Secondary | ICD-10-CM | POA: Diagnosis not present

## 2024-01-03 DIAGNOSIS — Z85828 Personal history of other malignant neoplasm of skin: Secondary | ICD-10-CM | POA: Diagnosis not present

## 2024-01-03 DIAGNOSIS — D225 Melanocytic nevi of trunk: Secondary | ICD-10-CM | POA: Diagnosis not present

## 2024-01-03 DIAGNOSIS — X32XXXD Exposure to sunlight, subsequent encounter: Secondary | ICD-10-CM | POA: Diagnosis not present

## 2024-02-04 DIAGNOSIS — M62830 Muscle spasm of back: Secondary | ICD-10-CM | POA: Diagnosis not present

## 2024-02-04 DIAGNOSIS — M9902 Segmental and somatic dysfunction of thoracic region: Secondary | ICD-10-CM | POA: Diagnosis not present

## 2024-02-04 DIAGNOSIS — M25551 Pain in right hip: Secondary | ICD-10-CM | POA: Diagnosis not present

## 2024-02-04 DIAGNOSIS — M9903 Segmental and somatic dysfunction of lumbar region: Secondary | ICD-10-CM | POA: Diagnosis not present

## 2024-02-04 DIAGNOSIS — M9905 Segmental and somatic dysfunction of pelvic region: Secondary | ICD-10-CM | POA: Diagnosis not present

## 2024-02-06 DIAGNOSIS — M62830 Muscle spasm of back: Secondary | ICD-10-CM | POA: Diagnosis not present

## 2024-02-06 DIAGNOSIS — M9902 Segmental and somatic dysfunction of thoracic region: Secondary | ICD-10-CM | POA: Diagnosis not present

## 2024-02-06 DIAGNOSIS — M9903 Segmental and somatic dysfunction of lumbar region: Secondary | ICD-10-CM | POA: Diagnosis not present

## 2024-02-06 DIAGNOSIS — M25551 Pain in right hip: Secondary | ICD-10-CM | POA: Diagnosis not present

## 2024-02-06 DIAGNOSIS — M9905 Segmental and somatic dysfunction of pelvic region: Secondary | ICD-10-CM | POA: Diagnosis not present

## 2024-02-08 DIAGNOSIS — M9902 Segmental and somatic dysfunction of thoracic region: Secondary | ICD-10-CM | POA: Diagnosis not present

## 2024-02-08 DIAGNOSIS — M25551 Pain in right hip: Secondary | ICD-10-CM | POA: Diagnosis not present

## 2024-02-08 DIAGNOSIS — M9903 Segmental and somatic dysfunction of lumbar region: Secondary | ICD-10-CM | POA: Diagnosis not present

## 2024-02-08 DIAGNOSIS — M62830 Muscle spasm of back: Secondary | ICD-10-CM | POA: Diagnosis not present

## 2024-02-08 DIAGNOSIS — M9905 Segmental and somatic dysfunction of pelvic region: Secondary | ICD-10-CM | POA: Diagnosis not present

## 2024-03-11 DIAGNOSIS — L820 Inflamed seborrheic keratosis: Secondary | ICD-10-CM | POA: Diagnosis not present

## 2024-03-25 DIAGNOSIS — K900 Celiac disease: Secondary | ICD-10-CM | POA: Diagnosis not present

## 2024-03-25 DIAGNOSIS — R140 Abdominal distension (gaseous): Secondary | ICD-10-CM | POA: Diagnosis not present

## 2024-04-07 DIAGNOSIS — H1045 Other chronic allergic conjunctivitis: Secondary | ICD-10-CM | POA: Diagnosis not present

## 2024-04-07 DIAGNOSIS — H2513 Age-related nuclear cataract, bilateral: Secondary | ICD-10-CM | POA: Diagnosis not present

## 2024-04-11 DIAGNOSIS — M9902 Segmental and somatic dysfunction of thoracic region: Secondary | ICD-10-CM | POA: Diagnosis not present

## 2024-04-11 DIAGNOSIS — M546 Pain in thoracic spine: Secondary | ICD-10-CM | POA: Diagnosis not present

## 2024-04-11 DIAGNOSIS — M62830 Muscle spasm of back: Secondary | ICD-10-CM | POA: Diagnosis not present

## 2024-04-11 DIAGNOSIS — M9903 Segmental and somatic dysfunction of lumbar region: Secondary | ICD-10-CM | POA: Diagnosis not present

## 2024-04-11 DIAGNOSIS — M9905 Segmental and somatic dysfunction of pelvic region: Secondary | ICD-10-CM | POA: Diagnosis not present

## 2024-04-16 DIAGNOSIS — M9905 Segmental and somatic dysfunction of pelvic region: Secondary | ICD-10-CM | POA: Diagnosis not present

## 2024-04-16 DIAGNOSIS — M9902 Segmental and somatic dysfunction of thoracic region: Secondary | ICD-10-CM | POA: Diagnosis not present

## 2024-04-16 DIAGNOSIS — M546 Pain in thoracic spine: Secondary | ICD-10-CM | POA: Diagnosis not present

## 2024-04-16 DIAGNOSIS — M62830 Muscle spasm of back: Secondary | ICD-10-CM | POA: Diagnosis not present

## 2024-04-16 DIAGNOSIS — M9903 Segmental and somatic dysfunction of lumbar region: Secondary | ICD-10-CM | POA: Diagnosis not present

## 2024-04-28 DIAGNOSIS — K1120 Sialoadenitis, unspecified: Secondary | ICD-10-CM | POA: Diagnosis not present

## 2024-04-28 DIAGNOSIS — J029 Acute pharyngitis, unspecified: Secondary | ICD-10-CM | POA: Diagnosis not present

## 2024-04-28 DIAGNOSIS — R1312 Dysphagia, oropharyngeal phase: Secondary | ICD-10-CM | POA: Diagnosis not present

## 2024-07-31 LAB — LAB REPORT - SCANNED
Free T4: 1.41 ng/dL
TSH: 1.13 (ref 0.41–5.90)

## 2024-08-08 ENCOUNTER — Encounter: Payer: Self-pay | Admitting: "Endocrinology

## 2024-08-08 ENCOUNTER — Ambulatory Visit: Admitting: "Endocrinology

## 2024-08-08 VITALS — BP 104/56 | HR 68 | Ht 63.0 in | Wt 148.4 lb

## 2024-08-08 DIAGNOSIS — E039 Hypothyroidism, unspecified: Secondary | ICD-10-CM | POA: Insufficient documentation

## 2024-08-08 DIAGNOSIS — E0580 Other thyrotoxicosis without thyrotoxic crisis or storm: Secondary | ICD-10-CM | POA: Diagnosis not present

## 2024-08-08 MED ORDER — LEVOTHYROXINE SODIUM 175 MCG PO TABS: 175.0000 ug | ORAL_TABLET | Freq: Every day | ORAL | 1 refills | Status: AC

## 2024-08-19 ENCOUNTER — Ambulatory Visit (HOSPITAL_COMMUNITY)

## 2024-09-10 ENCOUNTER — Ambulatory Visit: Admitting: "Endocrinology
